# Patient Record
Sex: Male | Born: 1985 | Race: Black or African American | Hispanic: No | Marital: Single | Smoking: Never smoker
Health system: Southern US, Community
[De-identification: ages and names within clinical notes are randomized; demographics above are authoritative.]

## PROBLEM LIST (undated history)

## (undated) DIAGNOSIS — T7840XA Allergy, unspecified, initial encounter: Secondary | ICD-10-CM

## (undated) DIAGNOSIS — K219 Gastro-esophageal reflux disease without esophagitis: Secondary | ICD-10-CM

## (undated) DIAGNOSIS — F419 Anxiety disorder, unspecified: Secondary | ICD-10-CM

## (undated) HISTORY — DX: Anxiety disorder, unspecified: F41.9

## (undated) HISTORY — DX: Allergy, unspecified, initial encounter: T78.40XA

## (undated) HISTORY — DX: Gastro-esophageal reflux disease without esophagitis: K21.9

---

## 2008-05-16 ENCOUNTER — Ambulatory Visit: Payer: Self-pay | Admitting: Family Medicine

## 2008-05-16 LAB — CONVERTED CEMR LAB
ALT: 23 units/L (ref 0–53)
AST: 20 units/L (ref 0–37)
Albumin: 4.6 g/dL (ref 3.5–5.2)
Alkaline Phosphatase: 65 units/L (ref 39–117)
BUN: 8 mg/dL (ref 6–23)
Basophils Absolute: 0.1 10*3/uL (ref 0.0–0.1)
Basophils Relative: 1 % (ref 0–1)
Eosinophils Absolute: 0.4 10*3/uL (ref 0.0–0.7)
HDL: 51 mg/dL (ref >39)
LDL Cholesterol: 67 mg/dL (ref 0–99)
MCHC: 34.4 g/dL (ref 30.0–36.0)
MCV: 87.6 fL (ref 78.0–100.0)
Monocytes Relative: 8 % (ref 3–12)
Neutrophils Relative %: 49 % (ref 43–77)
Platelets: 268 10*3/uL (ref 150–400)
Potassium: 4 meq/L (ref 3.5–5.3)
RBC: 5.15 M/uL (ref 4.22–5.81)
RDW: 12.7 % (ref 11.5–15.5)
Total CHOL/HDL Ratio: 2.9

## 2012-04-17 ENCOUNTER — Ambulatory Visit (INDEPENDENT_AMBULATORY_CARE_PROVIDER_SITE_OTHER): Payer: BC Managed Care – PPO | Admitting: Family Medicine

## 2012-04-17 VITALS — BP 144/80 | HR 84 | Temp 97.4°F | Resp 16 | Ht 69.0 in | Wt 140.0 lb

## 2012-04-17 DIAGNOSIS — Z0289 Encounter for other administrative examinations: Secondary | ICD-10-CM

## 2012-04-17 DIAGNOSIS — Z0000 Encounter for general adult medical examination without abnormal findings: Secondary | ICD-10-CM | POA: Insufficient documentation

## 2012-04-17 NOTE — Patient Instructions (Signed)
Rotator Cuff Injury  The rotator cuff is the collective set of muscles and tendons that make up the stabilizing unit of your shoulder. This unit holds in the ball of the humerus (upper arm bone) in the socket of the scapula (shoulder blade). Injuries to this stabilizing unit most commonly come from sports or activities that cause the arm to be moved repeatedly over the head. Examples of this include throwing, weight lifting, swimming, racquet sports, or an injury such as falling on your arm. Chronic (longstanding) irritation of this unit can cause inflammation (soreness), bursitis, and eventual damage to the tendons to the point of rupture (tear). An acute (sudden) injury of the rotator cuff can result in a partial or complete tear. You may need surgery with complete tears. Small or partial rotator cuff tears may be treated conservatively with temporary immobilization, exercises and rest. Physical therapy may be needed.  HOME CARE INSTRUCTIONS    Apply ice to the injury for 15 to 20 minutes 3 to 4 times per day for the first 2 days. Put the ice in a plastic bag and place a towel between the bag of ice and your skin.   If you have a shoulder immobilizer (sling and straps), do not remove it for as long as directed by your caregiver or until you see a caregiver for a follow-up examination. If you need to remove it, move your arm as little as possible.   You may want to sleep on several pillows or in a recliner at night to lessen swelling and pain.   Only take over-the-counter or prescription medicines for pain, discomfort, or fever as directed by your caregiver.   Do simple hand squeezing exercises with a soft rubber ball to decrease hand swelling.  SEEK MEDICAL CARE IF:    Pain in your shoulder increases or new pain or numbness develops in your arm, hand, or fingers.   Your hand or fingers are colder than your other hand.  SEEK IMMEDIATE MEDICAL CARE IF:    Your arm, hand, or fingers are numb or  tingling.   Your arm, hand, or fingers are increasingly swollen and painful, or turn white or blue.  Document Released: 09/16/2000 Document Revised: 09/08/2011 Document Reviewed: 09/09/2008  [employer] Patient Information 2012 [employer].
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
## 2019-07-19 ENCOUNTER — Other Ambulatory Visit: Payer: Self-pay

## 2019-07-19 DIAGNOSIS — Z20822 Contact with and (suspected) exposure to covid-19: Secondary | ICD-10-CM

## 2019-07-21 LAB — NOVEL CORONAVIRUS, NAA: SARS-CoV-2, NAA: DETECTED — AB

## 2020-01-28 ENCOUNTER — Ambulatory Visit
Admission: EM | Admit: 2020-01-28 | Discharge: 2020-01-28 | Disposition: A | Discharge status: Home / Self Care | Payer: Self-pay | Attending: Physician Assistant | Admitting: Physician Assistant

## 2020-01-28 ENCOUNTER — Other Ambulatory Visit: Payer: Self-pay

## 2020-01-28 ENCOUNTER — Encounter: Payer: Self-pay | Admitting: Emergency Medicine

## 2020-01-28 ENCOUNTER — Ambulatory Visit (INDEPENDENT_AMBULATORY_CARE_PROVIDER_SITE_OTHER): Payer: Self-pay

## 2020-01-28 DIAGNOSIS — M79672 Pain in left foot: Secondary | ICD-10-CM

## 2020-01-28 MED ORDER — MELOXICAM 7.5 MG PO TABS: 7.5000 mg | ORAL_TABLET | Freq: Every day | ORAL | 0 refills | Status: DC

## 2020-01-28 NOTE — ED Triage Notes (Signed)
Pt here for left foot pain after injuring 2 days ago while skate boarding; CMS intact

## 2020-01-28 NOTE — ED Provider Notes (Signed)
[facility]    CSN: [id number] Arrival date & time: 01/28/20  6712      History   Chief Complaint Chief Complaint  Patient presents with  . Foot Pain    HPI Derek Knight is a 34 y.o. male.   34 year old male comes in for 2 day history of left foot pain after injury. Was skateboarding when he inverted left foot/ankle. Has had pain and swelling with painful weightbearing since. Had some tingling that resolved. Taking ibuprofen with mild relief. Returns to work tomorrow, and will be required to stand/walk for long hours and therefore came in for evaluation.      History reviewed. No pertinent past medical history.  Patient Active Problem List   Diagnosis Date Noted  . Annual physical exam 04/17/2012    History reviewed. No pertinent surgical history.     Home Medications    Prior to Admission medications   Medication Sig Start Date End Date Taking? Authorizing Provider  meloxicam (MOBIC) 7.5 MG tablet Take 1 tablet (7.5 mg total) by mouth daily. 01/28/20   Belinda Fisher, PA-C    Family History Family History  Problem Relation Age of Onset  . Diabetes Father     Social History Social History   Tobacco Use  . Smoking status: Never Smoker  . Smokeless tobacco: Never Used  Substance Use Topics  . Alcohol use: Yes  . Drug use: Never     Allergies   Patient has no known allergies.   Review of Systems Review of Systems  Reason unable to perform ROS: See HPI as above.     Physical Exam Triage Vital Signs ED Triage Vitals [01/28/20 0930]  Enc Vitals Group     BP (!) 143/96     Pulse Rate 76     Resp 18     Temp 98.2 F (36.8 C)     Temp Source Oral     SpO2 99 %     Weight      Height      Head Circumference      Peak Flow      Pain Score 4     Pain Loc      Pain Edu?      Excl. in GC?    No data found.  Updated Vital Signs BP (!) 143/96 (BP Location: Left Arm)   Pulse 76   Temp 98.2 F (36.8 C) (Oral)   Resp 18    SpO2 99%   Physical Exam Constitutional:      General: He is not in acute distress.    Appearance: Normal appearance. He is well-developed. He is not toxic-appearing or diaphoretic.  HENT:     Head: Normocephalic and atraumatic.  Eyes:     Conjunctiva/sclera: Conjunctivae normal.     Pupils: Pupils are equal, round, and reactive to light.  Pulmonary:     Effort: Pulmonary effort is normal. No respiratory distress.     Comments: Speaking in full sentences without difficulty Musculoskeletal:     Cervical back: Normal range of motion and neck supple.     Comments: Mild swelling to the proximal 4th and 5th MTP. No contusion, erythema, warmth. No tenderness to palpation of the ankle. Tenderness to palpation along 4th and 5th MTP. Full ROM of ankle/toes. Strength 5/5. Sensation intact, pedal pulse 2+  Skin:    General: Skin is warm and dry.  Neurological:     Mental Status: He is alert  and oriented to person, place, and time.      UC Treatments / Results  Labs (all labs ordered are listed, but only abnormal results are displayed) Labs Reviewed - No data to display  EKG   Radiology DG Foot Complete Left  Result Date: 01/28/2020 CLINICAL DATA:  Left foot pain EXAM: LEFT FOOT - COMPLETE 3+ VIEW COMPARISON:  None. FINDINGS: Early joint space narrowing in the 1st MTP joint. No acute bony abnormality. Specifically, no fracture, subluxation, or dislocation. IMPRESSION: No acute bony abnormality. Electronically Signed   By: Rolm Baptise M.D.   On: 01/28/2020 09:45    Procedures Procedures (including critical care time)  Medications Ordered in UC Medications - No data to display  Initial Impression / Assessment and Plan / UC Course  I have reviewed the triage vital signs and the nursing notes.  Pertinent labs & imaging results that were available during my care of the patient were reviewed by me and considered in my medical decision making (see chart for details).    Xray reviewed  by me negative for fracture, which was confirmed by radiology. NSAIDs, ice compress, ace wrap during activity. Expected course of healing discussed. Return precautions given. Patient expresses understanding and agrees to plan.  Final Clinical Impressions(s) / UC Diagnoses   Final diagnoses:  Left foot pain    ED Prescriptions    Medication Sig Dispense Auth. Provider   meloxicam (MOBIC) 7.5 MG tablet Take 1 tablet (7.5 mg total) by mouth daily. 15 tablet Ok Edwards, PA-C     PDMP not reviewed this encounter.   Ok Edwards, PA-C 01/28/20 [phone]

## 2020-01-28 NOTE — Discharge Instructions (Signed)
Start Mobic. Do not take ibuprofen (motrin/advil)/ naproxen (aleve) while on mobic. Ice compress, elevation, ace wrap during activity.  This may take a few weeks to completely resolve, but should be feeling better each week.  Follow-up with sports medicine for further evaluation if symptoms not improving.

## 2020-12-11 IMAGING — DX DG FOOT COMPLETE 3+V*L*
3 series · 3 of 3 positions shown · non-contrast
Comparison: None.

CLINICAL DATA: Left foot pain

EXAM:
LEFT FOOT - COMPLETE 3+ VIEW

[foot supine dp]
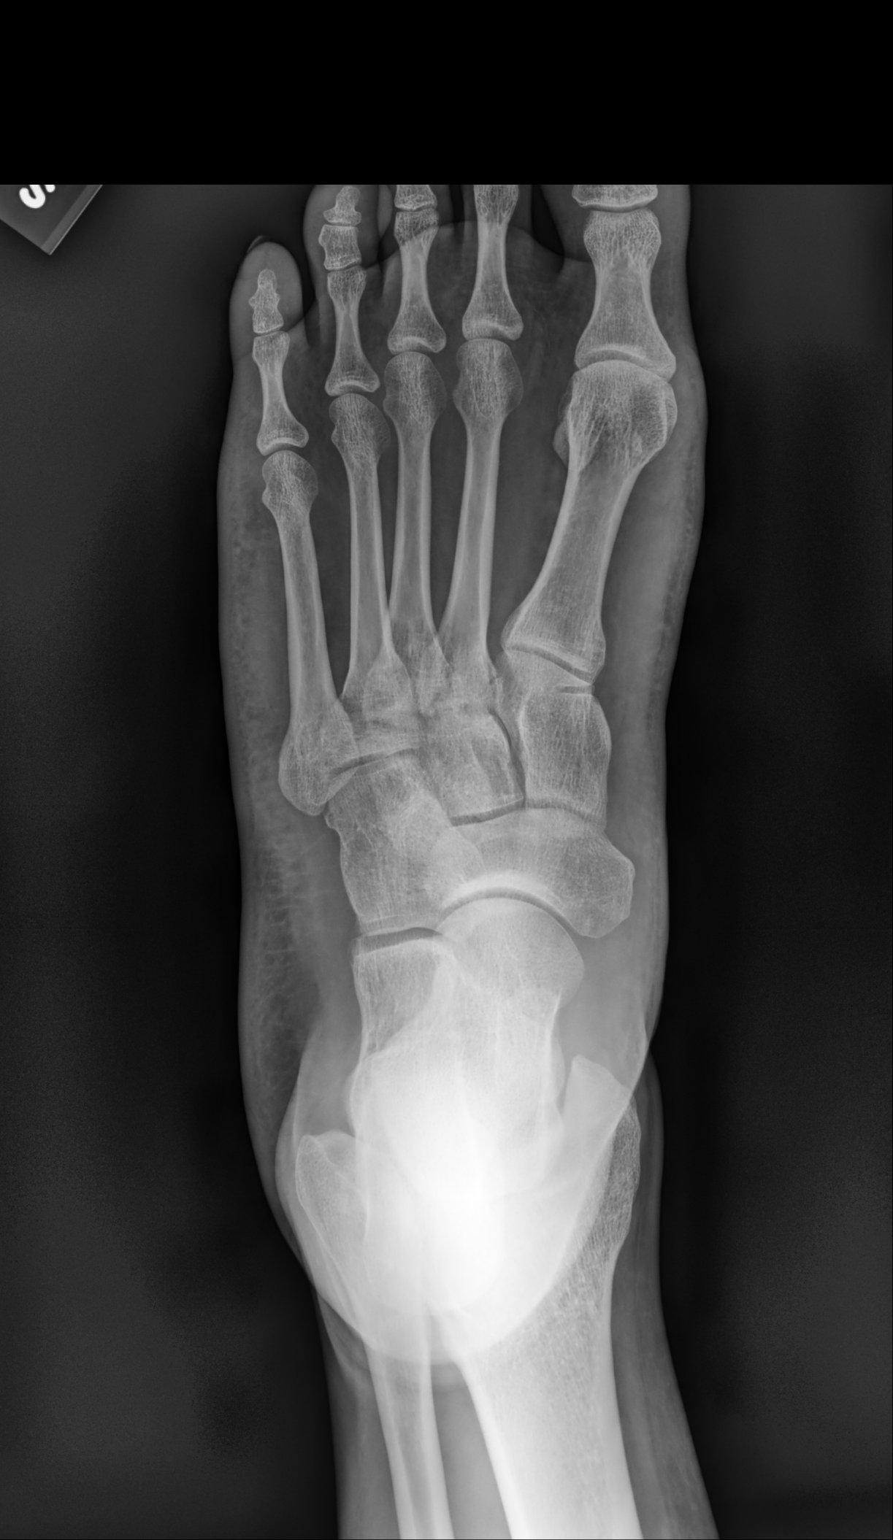

[foot medial oblique]
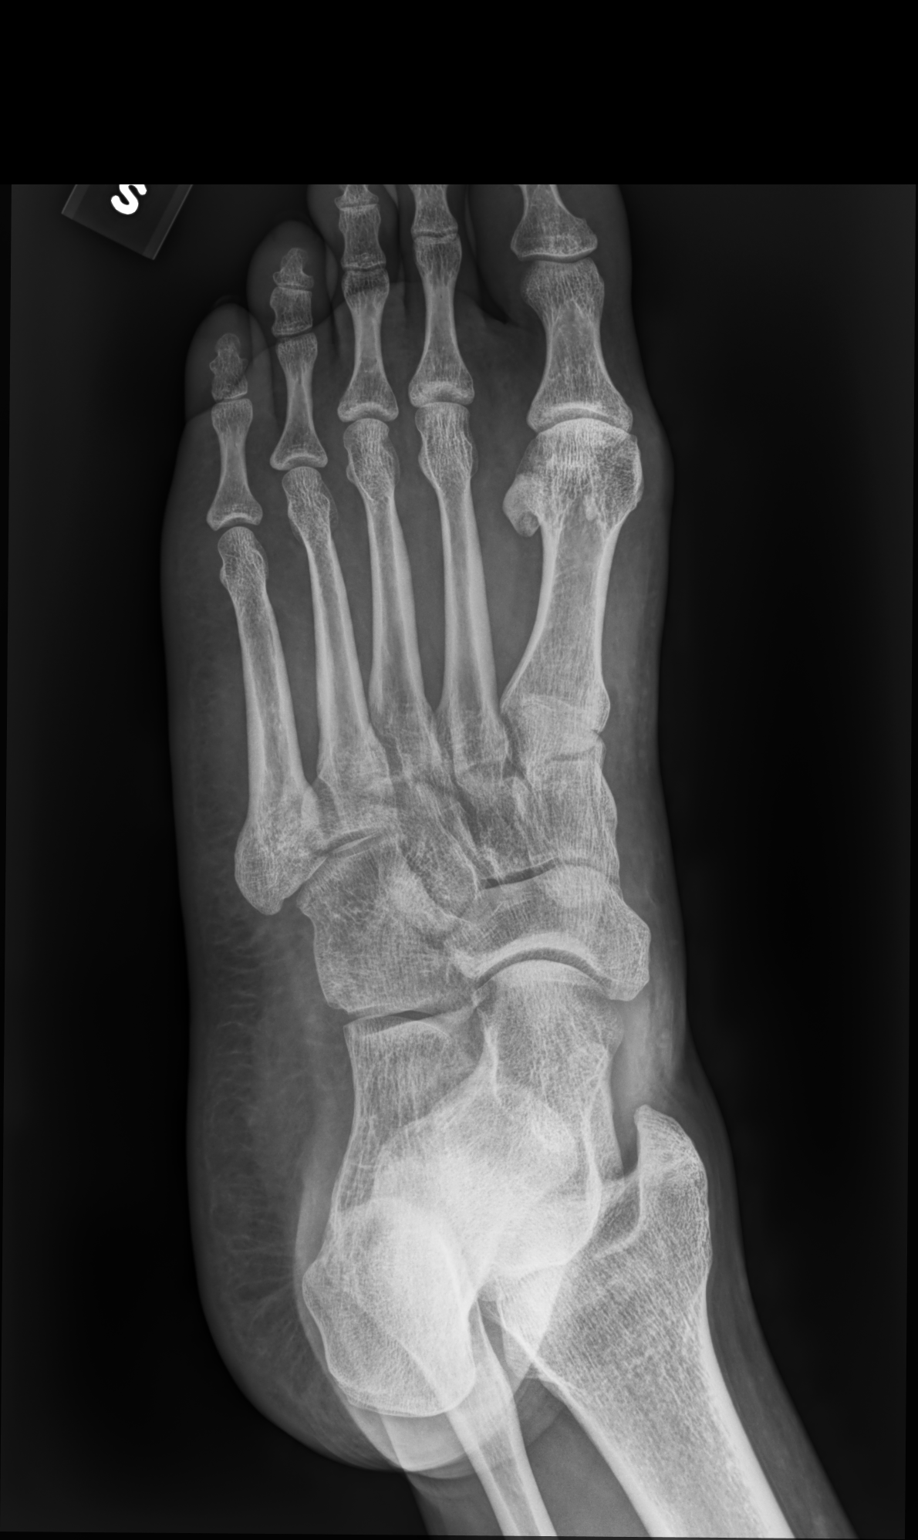

[foot supine lat]
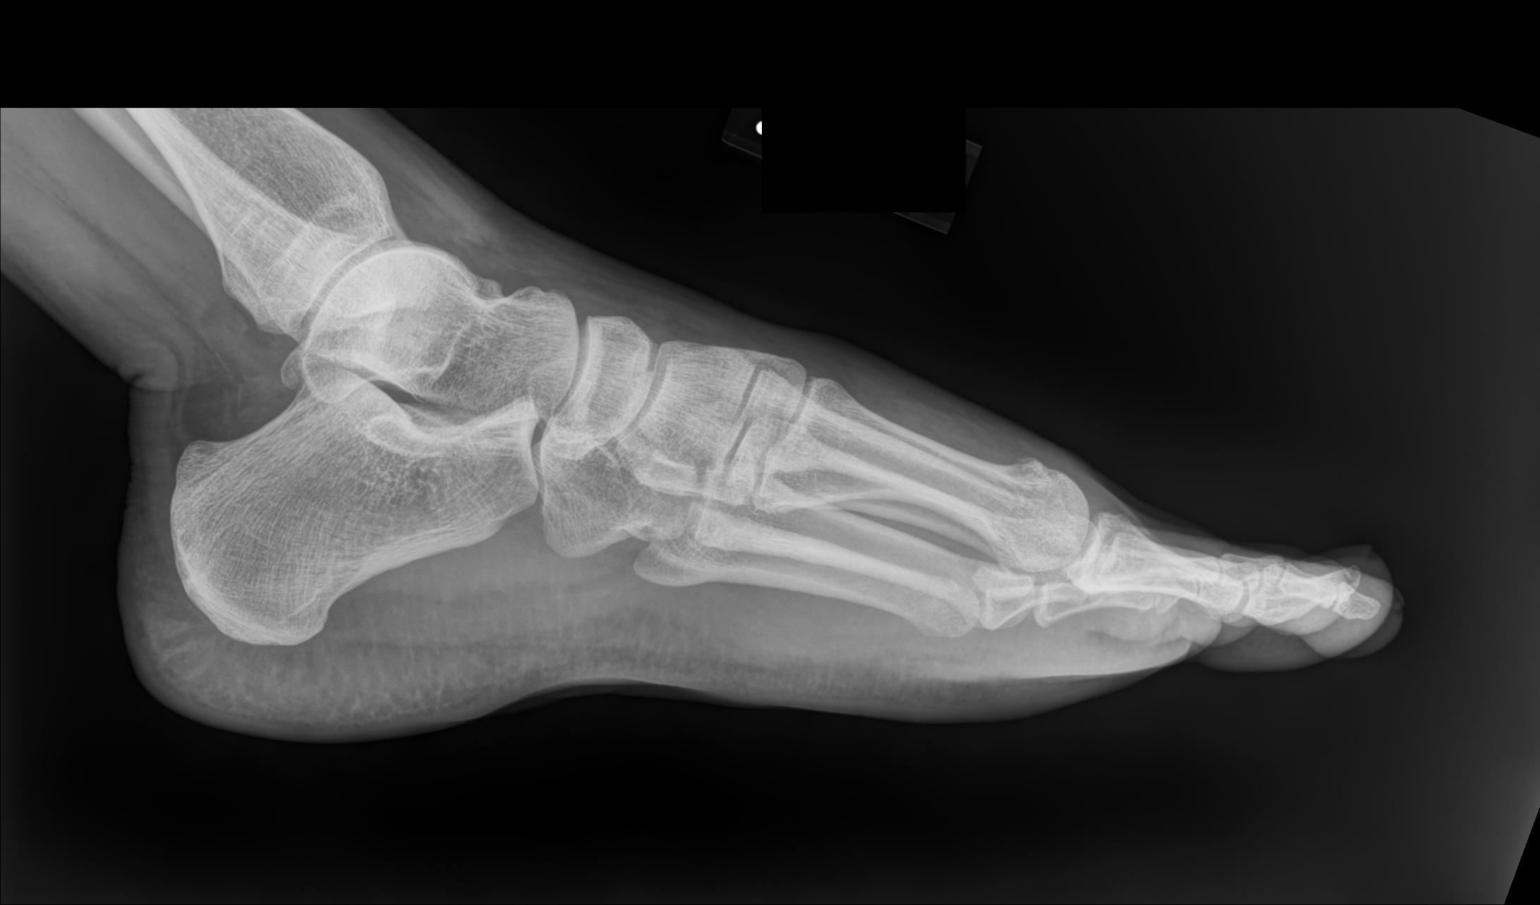

[3 of 3 positions shown; findings below may reference images not displayed]

FINDINGS: Early joint space narrowing in the 1st MTP joint. No acute bony
abnormality. Specifically, no fracture, subluxation, or dislocation.
IMPRESSION: No acute bony abnormality.

## 2021-02-12 ENCOUNTER — Ambulatory Visit
Admission: EM | Admit: 2021-02-12 | Discharge: 2021-02-12 | Disposition: A | Discharge status: Home / Self Care | Payer: BC Managed Care – PPO | Attending: Emergency Medicine | Admitting: Emergency Medicine

## 2021-02-12 ENCOUNTER — Other Ambulatory Visit: Payer: Self-pay

## 2021-02-12 DIAGNOSIS — H66011 Acute suppurative otitis media with spontaneous rupture of ear drum, right ear: Secondary | ICD-10-CM

## 2021-02-12 MED ORDER — CETIRIZINE HCL 10 MG PO CAPS: 10.0000 mg | ORAL_CAPSULE | Freq: Every day | ORAL | 0 refills | Status: DC

## 2021-02-12 MED ORDER — FLUTICASONE PROPIONATE 50 MCG/ACT NA SUSP: 1.0000 | Freq: Every day | NASAL | 0 refills | Status: DC

## 2021-02-12 MED ORDER — AMOXICILLIN-POT CLAVULANATE 875-125 MG PO TABS: 1.0000 | ORAL_TABLET | Freq: Two times a day (BID) | ORAL | 0 refills | Status: AC

## 2021-02-12 NOTE — Discharge Instructions (Signed)
Begin Augmentin twice daily x1 week Daily cetirizine and Flonase to help with sinus congestion, allergies, 30 irritation Drink plenty fluids Follow-up with ENT Follow-up if any symptoms changing worsening or not improve

## 2021-02-12 NOTE — ED Triage Notes (Signed)
Patient presents to Urgent Care with complaints of right ear fullness, throat and right arm tingling sensation that started a couple hours ago. Pt states arm tingling has resolved.  Denies fever, no changes in vision, or speech.

## 2021-02-13 NOTE — ED Provider Notes (Signed)
[facility]    CSN: [id number] Arrival date & time: 02/12/21  1613      History   Chief Complaint Chief Complaint  Patient presents with  . Otalgia  . Sore Throat    HPI NISSAN FRAZZINI is a 35 y.o. male presenting today for evaluation of sore throat and ear pain.  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  Reports tingling sensation in right arm which is since resolved  HPI  History reviewed. No pertinent past medical history.  Patient Active Problem List   Diagnosis Date Noted  . Annual physical exam 04/17/2012    History reviewed. No pertinent surgical history.     Home Medications    Prior to Admission medications   Medication Sig Start Date End Date Taking? Authorizing Provider  amoxicillin-clavulanate (AUGMENTIN) 875-125 MG tablet Take 1 tablet by mouth every 12 (twelve) hours for 7 days. 02/12/21 02/19/21 Yes Veneta Sliter C, PA-C  Cetirizine HCl 10 MG CAPS Take 1 capsule (10 mg total) by mouth daily for 10 days. 02/12/21 02/22/21 Yes Shalinda Burkholder C, PA-C  fluticasone (FLONASE) 50 MCG/ACT nasal spray Place 1-2 sprays into both nostrils daily. 02/12/21  Yes Josha Weekley C, PA-C  meloxicam (MOBIC) 7.5 MG tablet Take 1 tablet (7.5 mg total) by mouth daily. 01/28/20   Belinda Fisher, PA-C    Family History Family History  Problem Relation Age of Onset  . Diabetes Father     Social History Social History   Tobacco Use  . Smoking status: Never Smoker  . Smokeless tobacco: Never Used  Substance Use Topics  . Alcohol use: Yes  . Drug use: Never     Allergies   Patient has no known allergies.   Review of Systems Review of Systems  Constitutional: Negative for activity change, appetite change, chills, fatigue and fever.  HENT: Positive for congestion, ear discharge and rhinorrhea. Negative for ear pain, sinus pressure, sore throat and trouble swallowing.   Eyes:  Negative for discharge and redness.  Respiratory: Negative for cough, chest tightness and shortness of breath.   Cardiovascular: Negative for chest pain.  Gastrointestinal: Negative for abdominal pain, diarrhea, nausea and vomiting.  Musculoskeletal: Negative for myalgias.  Skin: Negative for rash.  Neurological: Negative for dizziness, light-headedness and headaches.     Physical Exam Triage Vital Signs ED Triage Vitals  Enc Vitals Group     BP 02/12/21 1831 126/78     Pulse Rate 02/12/21 1831 66     Resp 02/12/21 1831 20     Temp 02/12/21 1831 97.7 F (36.5 C)     Temp Source 02/12/21 1831 Oral     SpO2 02/12/21 1831 98 %     Weight --      Height --      Head Circumference --      Peak Flow --      Pain Score 02/12/21 1937 0     Pain Loc --      Pain Edu? --      Excl. in GC? --    No data found.  Updated Vital Signs BP 126/78 (BP Location: Left Arm)   Pulse 66   Temp 97.7 F (36.5 C) (Oral)   Resp 20   SpO2 98%   Visual Acuity Right Eye Distance:   Left Eye Distance:   Bilateral Distance:    Right Eye Near:   Left Eye Near:  Bilateral Near:     Physical Exam Vitals and nursing note reviewed.  Constitutional:      Appearance: He is well-developed.     Comments: No acute distress  HENT:     Head: Normocephalic and atraumatic.     Ears:     Comments: Right TM with rupture, appears opaque and dull, pustular drainage noted    Nose: Nose normal.  Eyes:     Conjunctiva/sclera: Conjunctivae normal.  Cardiovascular:     Rate and Rhythm: Normal rate.  Pulmonary:     Effort: Pulmonary effort is normal. No respiratory distress.     Comments: Breathing comfortably at rest, CTABL, no wheezing, rales or other adventitious sounds auscultated Abdominal:     General: There is no distension.  Musculoskeletal:        General: Normal range of motion.     Cervical back: Neck supple.  Skin:    General: Skin is warm and dry.  Neurological:     Mental Status: He  is alert and oriented to person, place, and time.      UC Treatments / Results  Labs (all labs ordered are listed, but only abnormal results are displayed) Labs Reviewed - No data to display  EKG   Radiology No results found.  Procedures Procedures (including critical care time)  Medications Ordered in UC Medications - No data to display  Initial Impression / Assessment and Plan / UC Course  I have reviewed the triage vital signs and the nursing notes.  Pertinent labs & imaging results that were available during my care of the patient were reviewed by me and considered in my medical decision making (see chart for details).     Treating for otitis media with rupture with Augmentin, recommended Zyrtec and Flonase to further help with any underlying congestion/eustachian tube dysfunction.  Discussed strict return precautions. Patient verbalized understanding and is agreeable with plan.  Final Clinical Impressions(s) / UC Diagnoses   Final diagnoses:  Non-recurrent acute suppurative otitis media of right ear with spontaneous rupture of tympanic membrane     Discharge Instructions     Begin Augmentin twice daily x1 week Daily cetirizine and Flonase to help with sinus congestion, allergies, 30 irritation Drink plenty fluids Follow-up with ENT Follow-up if any symptoms changing worsening or not improve    ED Prescriptions    Medication Sig Dispense Auth. Provider   amoxicillin-clavulanate (AUGMENTIN) 875-125 MG tablet Take 1 tablet by mouth every 12 (twelve) hours for 7 days. 14 tablet Theadora Noyes C, PA-C   fluticasone (FLONASE) 50 MCG/ACT nasal spray Place 1-2 sprays into both nostrils daily. 16 g Leviathan Macera C, PA-C   Cetirizine HCl 10 MG CAPS Take 1 capsule (10 mg total) by mouth daily for 10 days. 10 capsule Carmelo Reidel, [place] C, PA-C     PDMP not reviewed this encounter.   Lew Dawes, [place] 02/13/21 [phone]

## 2021-03-03 DIAGNOSIS — R0981 Nasal congestion: Secondary | ICD-10-CM | POA: Insufficient documentation

## 2021-10-15 ENCOUNTER — Ambulatory Visit: Payer: BC Managed Care – PPO

## 2021-10-15 ENCOUNTER — Emergency Department (HOSPITAL_BASED_OUTPATIENT_CLINIC_OR_DEPARTMENT_OTHER): Payer: BC Managed Care – PPO | Admitting: Radiology

## 2021-10-15 ENCOUNTER — Ambulatory Visit
Admission: EM | Admit: 2021-10-15 | Discharge: 2021-10-15 | Disposition: A | Discharge status: Home / Self Care | Payer: BC Managed Care – PPO | Attending: Internal Medicine | Admitting: Internal Medicine

## 2021-10-15 ENCOUNTER — Other Ambulatory Visit: Payer: Self-pay

## 2021-10-15 ENCOUNTER — Encounter (HOSPITAL_BASED_OUTPATIENT_CLINIC_OR_DEPARTMENT_OTHER): Payer: Self-pay

## 2021-10-15 ENCOUNTER — Emergency Department (HOSPITAL_BASED_OUTPATIENT_CLINIC_OR_DEPARTMENT_OTHER)
Admission: EM | Admit: 2021-10-15 | Discharge: 2021-10-15 | Disposition: A | Discharge status: Home / Self Care | Payer: BC Managed Care – PPO | Attending: Emergency Medicine | Admitting: Emergency Medicine

## 2021-10-15 DIAGNOSIS — J069 Acute upper respiratory infection, unspecified: Secondary | ICD-10-CM

## 2021-10-15 DIAGNOSIS — R079 Chest pain, unspecified: Secondary | ICD-10-CM | POA: Insufficient documentation

## 2021-10-15 DIAGNOSIS — R9431 Abnormal electrocardiogram [ECG] [EKG]: Secondary | ICD-10-CM | POA: Diagnosis not present

## 2021-10-15 DIAGNOSIS — Z20822 Contact with and (suspected) exposure to covid-19: Secondary | ICD-10-CM | POA: Diagnosis not present

## 2021-10-15 DIAGNOSIS — R0789 Other chest pain: Secondary | ICD-10-CM | POA: Diagnosis not present

## 2021-10-15 LAB — CBC
HCT: 45.7 % (ref 39.0–52.0)
Hemoglobin: 15.5 g/dL (ref 13.0–17.0)
MCH: 29.8 pg (ref 26.0–34.0)
MCHC: 33.9 g/dL (ref 30.0–36.0)
MCV: 87.7 fL (ref 80.0–100.0)
Platelets: 270 10*3/uL (ref 150–400)
RBC: 5.21 MIL/uL (ref 4.22–5.81)
RDW: 12 % (ref 11.5–15.5)
WBC: 6.2 10*3/uL (ref 4.0–10.5)
nRBC: 0 % (ref 0.0–0.2)

## 2021-10-15 LAB — BASIC METABOLIC PANEL
Anion gap: 12 (ref 5–15)
BUN: 11 mg/dL (ref 6–20)
CO2: 22 mmol/L (ref 22–32)
Calcium: 9.6 mg/dL (ref 8.9–10.3)
Chloride: 101 mmol/L (ref 98–111)
Creatinine, Ser: 0.9 mg/dL (ref 0.61–1.24)
GFR, Estimated: >60 mL/min (ref >60)
Glucose, Bld: 97 mg/dL (ref 70–99)
Potassium: 3.6 mmol/L (ref 3.5–5.1)
Sodium: 135 mmol/L (ref 135–145)

## 2021-10-15 LAB — RESP PANEL BY RT-PCR (FLU A&B, COVID) ARPGX2
Influenza A by PCR: NEGATIVE
Influenza B by PCR: NEGATIVE
SARS Coronavirus 2 by RT PCR: NEGATIVE

## 2021-10-15 LAB — TROPONIN I (HIGH SENSITIVITY): Troponin I (High Sensitivity): <2 ng/L (ref <18)

## 2021-10-15 MED ORDER — ALUM & MAG HYDROXIDE-SIMETH 200-200-20 MG/5ML PO SUSP
30.0000 mL | Freq: Once | ORAL | Status: AC
  Administered 2021-10-15: 30 mL via ORAL
  Filled 2021-10-15: qty 30

## 2021-10-15 MED ORDER — LIDOCAINE VISCOUS HCL 2 % MT SOLN
15.0000 mL | Freq: Once | OROMUCOSAL | Status: AC
  Administered 2021-10-15: 15 mL via ORAL
  Filled 2021-10-15: qty 15

## 2021-10-15 MED ORDER — FAMOTIDINE 20 MG PO TABS: 20.0000 mg | ORAL_TABLET | Freq: Two times a day (BID) | ORAL | 0 refills | Status: DC

## 2021-10-15 NOTE — ED Provider Notes (Signed)
[facility] Provider Note   CSN: [id number] Arrival date & time: 10/15/21  1140     History  Chief Complaint  Patient presents with   Abnormal ECG    Derek Knight is a 36 y.o. male.  HPI  Patient with no contributable medical history presents due to chest pain.  Its been intermittent for the last 5 days, last for about 5 seconds when it occurs.  He feels it to the right side of his chest, does not radiate.  Feels like a burning sensation.  He notices it more when he lays flat, no relationship to food or exertion.  There is no nausea or vomiting.  Patient reports she was seen earlier today at urgent care, there were PACs and PVCs on his EKG today advised him to go to the ED for additional cardiac work-up.  Patient does not have a cardiac family history, does not take medicines hypertension, hyperlipidemia, diabetes.  No previous MI or PE.  Patient does drink up to 8 cups of caffeine daily.  Home Medications Prior to Admission medications   Medication Sig Start Date End Date Taking? Authorizing Provider  famotidine (PEPCID) 20 MG tablet Take 1 tablet (20 mg total) by mouth 2 (two) times daily. 10/15/21  Yes Sherrill Raring, PA-C  Cetirizine HCl 10 MG CAPS Take 1 capsule (10 mg total) by mouth daily for 10 days. 02/12/21 02/22/21  Wieters, Hallie C, PA-C  fluticasone (FLONASE) 50 MCG/ACT nasal spray Place 1-2 sprays into both nostrils daily. 02/12/21   Wieters, Hallie C, PA-C  meloxicam (MOBIC) 7.5 MG tablet Take 1 tablet (7.5 mg total) by mouth daily. 01/28/20   Ok Edwards, PA-C      Allergies    Patient has no known allergies.    Review of Systems   Review of Systems Per HPI  Physical Exam Updated Vital Signs BP 127/84    Pulse 76    Temp 98.3 F (36.8 C) (Oral)    Resp 17    Ht 5\' 6"  (1.676 m)    Wt 71.2 kg    SpO2 98%    BMI 25.34 kg/m  Physical Exam Vitals and nursing note reviewed. Exam conducted with a chaperone present.  Constitutional:       Appearance: Normal appearance.  HENT:     Head: Normocephalic and atraumatic.     Ears:     Comments: Left TM perforation, right TM intact.  No erythema or bulging TM.    Nose: Congestion present.  Eyes:     General: No scleral icterus.       Right eye: No discharge.        Left eye: No discharge.     Extraocular Movements: Extraocular movements intact.     Pupils: Pupils are equal, round, and reactive to light.  Cardiovascular:     Rate and Rhythm: Normal rate and regular rhythm.     Pulses: Normal pulses.     Heart sounds: Normal heart sounds. No murmur heard.   No friction rub. No gallop.  Pulmonary:     Effort: Pulmonary effort is normal. No respiratory distress.     Breath sounds: Normal breath sounds.  Abdominal:     General: Abdomen is flat. Bowel sounds are normal. There is no distension.     Palpations: Abdomen is soft.     Tenderness: There is no abdominal tenderness.  Skin:    General: Skin is warm and dry.  Coloration: Skin is not jaundiced.  Neurological:     Mental Status: He is alert. Mental status is at baseline.     Coordination: Coordination normal.    ED Results / Procedures / Treatments   Labs (all labs ordered are listed, but only abnormal results are displayed) Labs Reviewed  RESP PANEL BY RT-PCR (FLU A&B, COVID) ARPGX2  BASIC METABOLIC PANEL  CBC  TROPONIN I (HIGH SENSITIVITY)    EKG None  Radiology DG Chest 2 View  Result Date: 10/15/2021 CLINICAL DATA:  Abnormal EKG. EXAM: CHEST - 2 VIEW COMPARISON:  None. FINDINGS: The heart size and mediastinal contours are within normal limits. Both lungs are clear. The visualized skeletal structures are unremarkable. IMPRESSION: No active cardiopulmonary disease. Electronically Signed   By: Davina Poke D.O.   On: 10/15/2021 12:18    Procedures Procedures    Medications Ordered in ED Medications  alum & mag hydroxide-simeth (MAALOX/MYLANTA) 200-200-20 MG/5ML suspension 30 mL (30 mLs Oral  Given 10/15/21 1250)    And  lidocaine (XYLOCAINE) 2 % viscous mouth solution 15 mL (15 mLs Oral Given 10/15/21 1250)    ED Course/ Medical Decision Making/ A&P                           Medical Decision Making  This is a 36 year old male presenting due to chest pain.  I personally reviewed the labs and imaging ordered today in the ED.  I agree with the radiologist interpretation of the chest x-ray.  I also independently reviewed the provider note from the urgent care visit earlier today.  Patient vital signs are stable, he is not hypoxic or tachycardic.  Physical exam is unremarkable.  He is also PERC negative, I do not suspect this is related to a PE.  Could be related to GERD given the positional factor.  Does not sound like a pericarditis given that it is not worsened by leaving forward and its intermittent nature.  Additionally there was no global ST elevation or PR depression noted on EKG. EKG does not show any ischemic findings.  He has a normal sinus rhythm with occasional premature ventricular contractions.  He was hooked up with cardiac monitoring which I dependently reviewed showing a predominant rhythm of normal sinus rhythm with occasional premature ventricle contractions.  CBC does not show any leukocytosis, additionally no anemia.  BMP is without any electrolyte derangement, no AKI.  Chest x-ray shows normal cardiac silhouette without any evidence of pneumonia, pneumothorax, widening mediastinum concerning for dissection.  Additionally, respiratory panel is negative for any COVID or flu.  Patient was given GI cocktail, this alleviated his symptoms.  On reevaluation, patient presents improved.  His lungs remain clear to auscultation, his vital signs remained stable.  There is no signs of continued tachypnea or hypoxia concerning for acute respiratory distress.  I suspect his symptoms are more likely related to reflux disease.  I did advise him to cut back on the amount of caffeine he drinks  and prescribed Pepcid.  Patient discharged in stable condition with return precautions.  Do not feel this time he needs any additional work-up in the emergency setting.        Final Clinical Impression(s) / ED Diagnoses Final diagnoses:  Chest pain, unspecified type    Rx / DC Orders ED Discharge Orders          Ordered    famotidine (PEPCID) 20 MG tablet  2 times daily  10/15/21 1308              Sherrill Raring, PA-C 10/15/21 1316    Charlesetta Shanks, MD 10/15/21 1402

## 2021-10-15 NOTE — ED Triage Notes (Signed)
Pt c/o left ear pain, muscle soreness to left chest, dizziness, nasal congestion without drainage  Denies cough, sore throat, headache, nausea, vomiting.   Onset ~ >1 week

## 2021-10-15 NOTE — ED Triage Notes (Signed)
Pt present to UC for URI symptoms. Per pt he was sent to ED due to an abnormal EKG. Pt reports mild burning in his R chest. Pr reports increased burping. Denies ShOB, nausea, or diaphoresis.

## 2021-10-15 NOTE — ED Notes (Signed)
Patient reports he drinks 1-2 cups of coffee daily and has an energy drink during the day sometimes. Patient reports approximately 8-10 cups of coffee per week and about 4 energy drinks a week. States he also utilized vape pens.

## 2021-10-15 NOTE — Discharge Instructions (Addendum)
Your work-up today was reassuring.  There were no signs of heart attack, pneumonia or infection.  You were negative for COVID and flu.  I would follow-up with your primary care doctor if this continues, you can also try cutting back on the amount of caffeine you consume.  Try taking 20 mg of Pepcid twice daily for the next 4 weeks to see if that helps the pain.  I suspect it could also be related to reflux disease and Pepcid as an antacid.

## 2021-10-15 NOTE — Discharge Instructions (Signed)
Please go to the hospital as soon as you leave urgent care for further evaluation and management due to your chest pain.

## 2021-10-15 NOTE — ED Provider Notes (Signed)
[facility]    CSN: [id number] Arrival date & time: 10/15/21  1018      History   Chief Complaint Chief Complaint  Patient presents with   Otalgia    HPI ELCHANAN DALESANDRO is a 36 y.o. male.   Patient presents with 1 week history of nasal congestion, left ear pain, dizziness, chest pain.  Denies any known sick contacts or fever.  Patient reports that the chest pain is present throughout entire chest and is intermittent.  Chest pain is mild.  Denies any relieving or aggravating factors to chest pain.  Patient denies cough, shortness of breath, headache, sore throat, nausea, vomiting, diarrhea, abdominal pain.  Patient has taken Advil for symptoms with minimal improvement.  Patient is attributing his chest pain to heartburn but denies history of heartburn or that food makes chest pain worse.  He reports that chest pain is worsened when lying flat and upon awakening in the morning.   Otalgia  History reviewed. No pertinent past medical history.  Patient Active Problem List   Diagnosis Date Noted   Annual physical exam 04/17/2012    History reviewed. No pertinent surgical history.     Home Medications    Prior to Admission medications   Medication Sig Start Date End Date Taking? Authorizing Provider  Cetirizine HCl 10 MG CAPS Take 1 capsule (10 mg total) by mouth daily for 10 days. 02/12/21 02/22/21  Wieters, Hallie C, PA-C  fluticasone (FLONASE) 50 MCG/ACT nasal spray Place 1-2 sprays into both nostrils daily. 02/12/21   Wieters, Hallie C, PA-C  meloxicam (MOBIC) 7.5 MG tablet Take 1 tablet (7.5 mg total) by mouth daily. 01/28/20   Ok Edwards, PA-C    Family History Family History  Problem Relation Age of Onset   Diabetes Father     Social History Social History   Tobacco Use   Smoking status: Never   Smokeless tobacco: Never  Substance Use Topics   Alcohol use: Yes   Drug use: Never     Allergies   Patient has no known allergies.   Review of  Systems Review of Systems Per HPI  Physical Exam Triage Vital Signs ED Triage Vitals  Enc Vitals Group     BP 10/15/21 1035 132/86     Pulse Rate 10/15/21 1035 62     Resp 10/15/21 1035 18     Temp 10/15/21 1035 98 F (36.7 C)     Temp Source 10/15/21 1035 Oral     SpO2 10/15/21 1035 99 %     Weight --      Height --      Head Circumference --      Peak Flow --      Pain Score 10/15/21 1036 0     Pain Loc --      Pain Edu? --      Excl. in [place]? --    No data found.  Updated Vital Signs BP 132/86 (BP Location: Right Arm)    Pulse 62    Temp 98 F (36.7 C) (Oral)    Resp 18    SpO2 99%   Visual Acuity Right Eye Distance:   Left Eye Distance:   Bilateral Distance:    Right Eye Near:   Left Eye Near:    Bilateral Near:     Physical Exam Constitutional:      General: He is not in acute distress.    Appearance: Normal appearance. He is not toxic-appearing or  diaphoretic.  HENT:     Head: Normocephalic and atraumatic.     Right Ear: Tympanic membrane and ear canal normal.     Left Ear: Tympanic membrane and ear canal normal.     Nose: Congestion present.     Mouth/Throat:     Mouth: Mucous membranes are moist.     Pharynx: No posterior oropharyngeal erythema.  Eyes:     Extraocular Movements: Extraocular movements intact.     Conjunctiva/sclera: Conjunctivae normal.     Pupils: Pupils are equal, round, and reactive to light.  Cardiovascular:     Rate and Rhythm: Normal rate and regular rhythm.     Pulses: Normal pulses.     Heart sounds: Normal heart sounds.  Pulmonary:     Effort: Pulmonary effort is normal. No respiratory distress.     Breath sounds: Normal breath sounds. No stridor. No wheezing, rhonchi or rales.  Abdominal:     General: Abdomen is flat. Bowel sounds are normal.     Palpations: Abdomen is soft.  Musculoskeletal:        General: Normal range of motion.     Cervical back: Normal range of motion.  Skin:    General: Skin is warm and dry.   Neurological:     General: No focal deficit present.     Mental Status: He is alert and oriented to person, place, and time. Mental status is at baseline.  Psychiatric:        Mood and Affect: Mood normal.        Behavior: Behavior normal.     UC Treatments / Results  Labs (all labs ordered are listed, but only abnormal results are displayed) Labs Reviewed  COVID-19, FLU A+B NAA    EKG   Radiology No results found.  Procedures Procedures (including critical care time)  Medications Ordered in UC Medications - No data to display  Initial Impression / Assessment and Plan / UC Course  I have reviewed the triage vital signs and the nursing notes.  Pertinent labs & imaging results that were available during my care of the patient were reviewed by me and considered in my medical decision making (see chart for details).     Patient's symptoms appear viral in etiology.  Although, EKG was completed due to patient's inconsistent and persistent chest pain.  EKG showing sinus arrhythmia with premature ventricular complexes.  Patient denies any cardiac history.  Unable to rule out normalities on EKG as cause of patient's chest pain.  Is most likely related to respiratory illness or heartburn but patient needs more extensive evaluation given EKG abnormalities.  This extensive evaluation cannot be provided at the urgent care.  Patient was advised to go to the hospital for further evaluation and management.  Patient was agreeable with plan.  Vital signs stable at discharge.  Agree with patient self transport to the hospital. Final Clinical Impressions(s) / UC Diagnoses   Final diagnoses:  Viral upper respiratory infection  Other chest pain     Discharge Instructions      Please go to the hospital as soon as you leave urgent care for further evaluation and management due to your chest pain.    ED Prescriptions   None    PDMP not reviewed this encounter.   Teodora Medici,  [place] 10/15/21 1139

## 2021-10-16 LAB — COVID-19, FLU A+B NAA
Influenza A, NAA: NOT DETECTED
Influenza B, NAA: NOT DETECTED
SARS-CoV-2, NAA: NOT DETECTED

## 2021-10-22 DIAGNOSIS — K2970 Gastritis, unspecified, without bleeding: Secondary | ICD-10-CM | POA: Diagnosis not present

## 2021-11-03 DIAGNOSIS — R109 Unspecified abdominal pain: Secondary | ICD-10-CM | POA: Insufficient documentation

## 2021-12-01 ENCOUNTER — Encounter: Payer: Self-pay | Admitting: Family Medicine

## 2021-12-01 ENCOUNTER — Ambulatory Visit: Payer: BC Managed Care – PPO | Attending: Family Medicine | Admitting: Family Medicine

## 2021-12-01 VITALS — BP 125/72 | HR 65 | Ht 66.0 in | Wt 158.6 lb

## 2021-12-01 DIAGNOSIS — K219 Gastro-esophageal reflux disease without esophagitis: Secondary | ICD-10-CM | POA: Diagnosis not present

## 2021-12-01 DIAGNOSIS — R0789 Other chest pain: Secondary | ICD-10-CM | POA: Diagnosis not present

## 2021-12-01 DIAGNOSIS — G4709 Other insomnia: Secondary | ICD-10-CM

## 2021-12-01 MED ORDER — TRAZODONE HCL 50 MG PO TABS: 50.0000 mg | ORAL_TABLET | Freq: Every evening | ORAL | 3 refills | Status: DC | PRN

## 2021-12-01 NOTE — Progress Notes (Signed)
Still having minor chest pains. ?Nasal congestion. ?Not sleeping. ?

## 2021-12-01 NOTE — Progress Notes (Signed)
? ?Subjective:  ?Patient ID: Derek Knight, male    DOB: 1986-01-19  Age: 36 y.o. MRN: [id number] ? ?CC: Hospitalization Follow-up ? ? ?HPI ?Derek Knight is a 36 y.o. year old male here to establish care. ?He was seen 1 month ago at the ED for chest pain described as a burning sensation.  EKG was negative for ischemic findings but revealed PVCs, chest x-ray unremarkable, symptoms resolved with GI cocktail.  Per notes symptoms are suspicious for GERD. ? ?Interval History: ?He has intermittent L sided chest pain which sometimes is in his upper chest and moves around.  But he states feels like a soreness but not sharp pain. ?He currently does not take any pain medications.  He endorses lifting a lot at work. ? ?He had a visit to [facility] for a follow up. He no longer has epigastric pain and does not take any PPI. ? ?He complains of insomnia and only sleeps 4-5 hrs. When he wakes up he is unable to go back to pain. Bedtime is at 9pm ?He drinks coffee just in the morning. He saw a Doctor on demand through his job who prescribed Trazodone which has helped somewhat with his sleep now 4-5 hrs compared to complete insomnia which has been occurring over the last 3 months. ? ?No past medical history on file. ? ?No past surgical history on file. ? ?Family History  ?Problem Relation Age of Onset  ? Diabetes Father   ? ? ?No Known Allergies ? ?Outpatient Medications Prior to Visit  ?Medication Sig Dispense Refill  ? famotidine (PEPCID) 20 MG tablet Take 1 tablet (20 mg total) by mouth 2 (two) times daily. 30 tablet 0  ? fluticasone (FLONASE) 50 MCG/ACT nasal spray Place 1-2 sprays into both nostrils daily. 16 g 0  ? meloxicam (MOBIC) 7.5 MG tablet Take 1 tablet (7.5 mg total) by mouth daily. 15 tablet 0  ? Cetirizine HCl 10 MG CAPS Take 1 capsule (10 mg total) by mouth daily for 10 days. 10 capsule 0  ? ?No facility-administered medications prior to visit.  ? ? ? ?ROS ?Review of Systems  ?Constitutional:  Negative  for activity change and appetite change.  ?HENT:  Positive for congestion. Negative for sinus pressure and sore throat.   ?Eyes:  Negative for visual disturbance.  ?Respiratory:  Negative for cough, chest tightness and shortness of breath.   ?Cardiovascular:  Negative for chest pain and leg swelling.  ?Gastrointestinal:  Negative for abdominal distention, abdominal pain, constipation and diarrhea.  ?Endocrine: Negative.   ?Genitourinary:  Negative for dysuria.  ?Musculoskeletal:  Negative for joint swelling and myalgias.  ?Skin:  Negative for rash.  ?Allergic/Immunologic: Negative.   ?Neurological:  Negative for weakness, light-headedness and numbness.  ?Psychiatric/Behavioral:  Positive for sleep disturbance. Negative for dysphoric mood and suicidal ideas.   ? ?Objective:  ?BP 125/72   Pulse 65   Ht 5\' 6"  (1.676 m)   Wt 158 lb 9.6 oz (71.9 kg)   SpO2 100%   BMI 25.60 kg/m?  ? ?BP/Weight 12/01/2021 10/15/2021 10/15/2021  ?Systolic BP 125 127 132  ?Diastolic BP 72 84 86  ?Wt. (Lbs) 158.6 157 -  ?BMI 25.6 25.34 -  ? ? ? ? ?Physical Exam ?Constitutional:   ?   Appearance: He is well-developed.  ?Cardiovascular:  ?   Rate and Rhythm: Normal rate.  ?   Heart sounds: Normal heart sounds. No murmur heard. ?Pulmonary:  ?   Effort: Pulmonary effort is normal.  ?  Breath sounds: Normal breath sounds. No wheezing or rales.  ?Chest:  ?   Chest wall: No tenderness.  ?Abdominal:  ?   General: Bowel sounds are normal. There is no distension.  ?   Palpations: Abdomen is soft. There is no mass.  ?   Tenderness: There is no abdominal tenderness.  ?Musculoskeletal:     ?   General: Normal range of motion.  ?   Right lower leg: No edema.  ?   Left lower leg: No edema.  ?Neurological:  ?   Mental Status: He is alert and oriented to person, place, and time.  ?Psychiatric:     ?   Mood and Affect: Mood normal.  ? ? ?CMP Latest Ref Rng & Units 10/15/2021 05/16/2008  ?Glucose 70 - 99 mg/dL 97 50(K)  ?BUN 6 - 20 mg/dL 11 8  ?Creatinine 0.61  - 1.24 mg/dL 9.38 1.82  ?Sodium 135 - 145 mmol/L 135 141  ?Potassium 3.5 - 5.1 mmol/L 3.6 4.0  ?Chloride 98 - 111 mmol/L 101 105  ?CO2 22 - 32 mmol/L 22 24  ?Calcium 8.9 - 10.3 mg/dL 9.6 8.8  ?Total Protein 6.0 - 8.3 g/dL - 7.4  ?Total Bilirubin 0.3 - 1.2 mg/dL - 0.8  ?Alkaline Phos 39 - 117 units/L - 65  ?AST 0 - 37 units/L - 20  ?ALT 0 - 53 units/L - 23  ? ? ?Lipid Panel  ?   ?Component Value Date/Time  ? CHOL 150 05/16/2008 2002  ? TRIG 162 (H) 05/16/2008 2002  ? HDL 51 05/16/2008 2002  ? CHOLHDL 2.9 Ratio 05/16/2008 2002  ? VLDL 32 05/16/2008 2002  ? LDLCALC 67 05/16/2008 2002  ? ? ?CBC ?   ?Component Value Date/Time  ? WBC 6.2 10/15/2021 1200  ? RBC 5.21 10/15/2021 1200  ? HGB 15.5 10/15/2021 1200  ? HCT 45.7 10/15/2021 1200  ? PLT 270 10/15/2021 1200  ? MCV 87.7 10/15/2021 1200  ? MCH 29.8 10/15/2021 1200  ? MCHC 33.9 10/15/2021 1200  ? RDW 12.0 10/15/2021 1200  ? LYMPHSABS 2.4 05/16/2008 2002  ? MONOABS 0.5 05/16/2008 2002  ? EOSABS 0.4 05/16/2008 2002  ? BASOSABS 0.1 05/16/2008 2002  ? ? ?No results found for: HGBA1C ? ?Assessment & Plan:  ?1. Other insomnia ?Uncontrolled ?He has had some improvement on trazodone ?Discussed with him that given his young age I am skeptical on increasing his current dose of trazodone ?He does get about 4 to 5 hours of sleep and we have discussed sleep hygiene and I will follow-up at his next visit with ?- traZODone (DESYREL) 50 MG tablet; Take 1 tablet (50 mg total) by mouth at bedtime as needed for sleep.  Dispense: 30 tablet; Refill: 3 ? ?2. Gastroesophageal reflux disease without esophagitis ?Symptoms have improved ?Use PPI as needed ? ?3. Musculoskeletal chest pain ?Given heavy lifting at work this could explain his symptoms ?GI etiology is also possible ?Advised to use analgesic if this occurs ? ? ?Meds ordered this encounter  ?Medications  ? traZODone (DESYREL) 50 MG tablet  ?  Sig: Take 1 tablet (50 mg total) by mouth at bedtime as needed for sleep.  ?  Dispense:  30  tablet  ?  Refill:  3  ? ? ?Follow-up: Return in about 3 months (around 03/03/2022).  ? ? ? ? ? ?Hoy Register, MD, FAAFP. ?[facility] ?[place], [place] ?[phone]   ?12/01/2021, 5:37 PM ?

## 2022-03-07 ENCOUNTER — Ambulatory Visit: Payer: BC Managed Care – PPO | Admitting: Family Medicine

## 2022-08-29 IMAGING — DX DG CHEST 2V
2 series · 2 of 2 positions shown · non-contrast
Comparison: None.

CLINICAL DATA: Abnormal EKG.

EXAM:
CHEST - 2 VIEW

[chest pa]
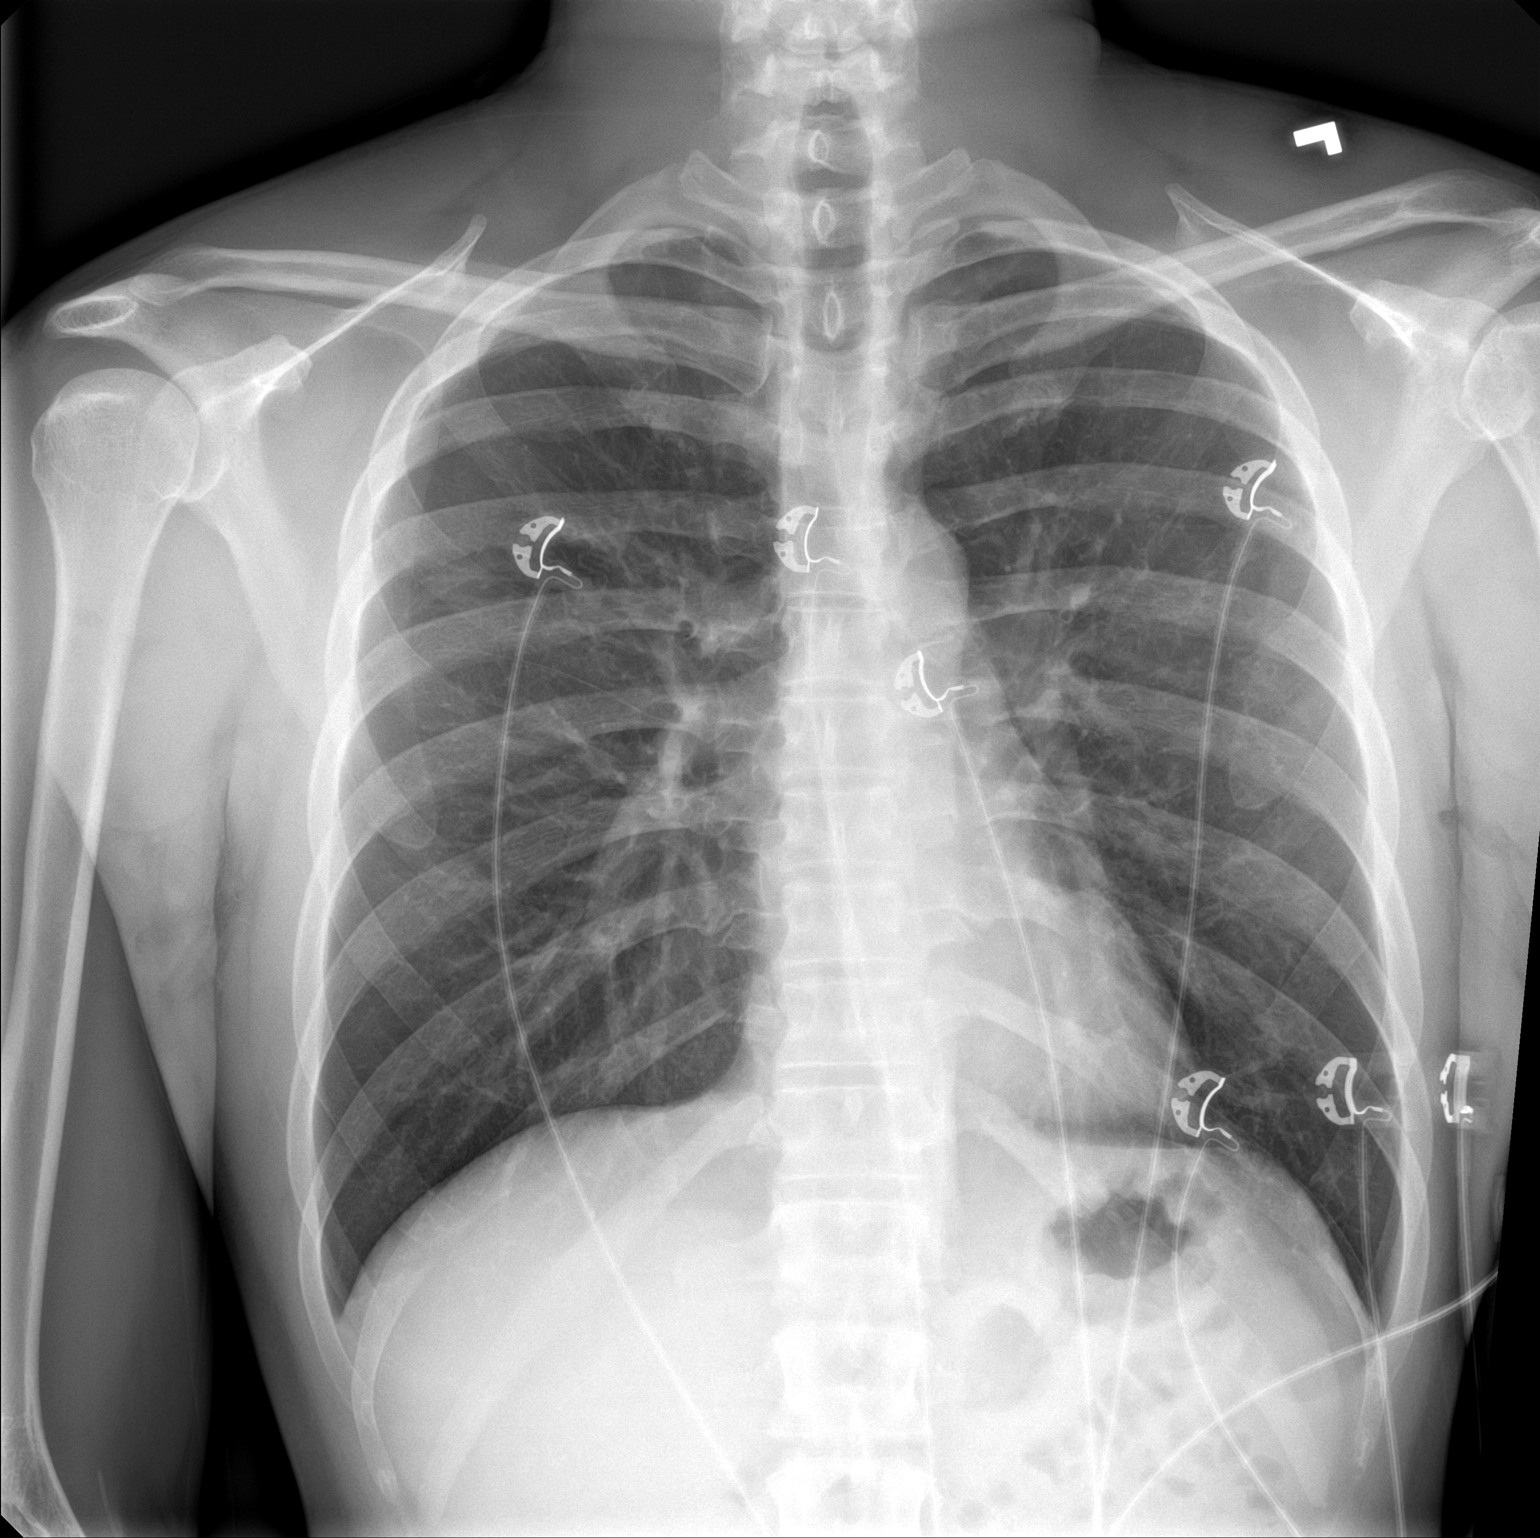

[chest lat]
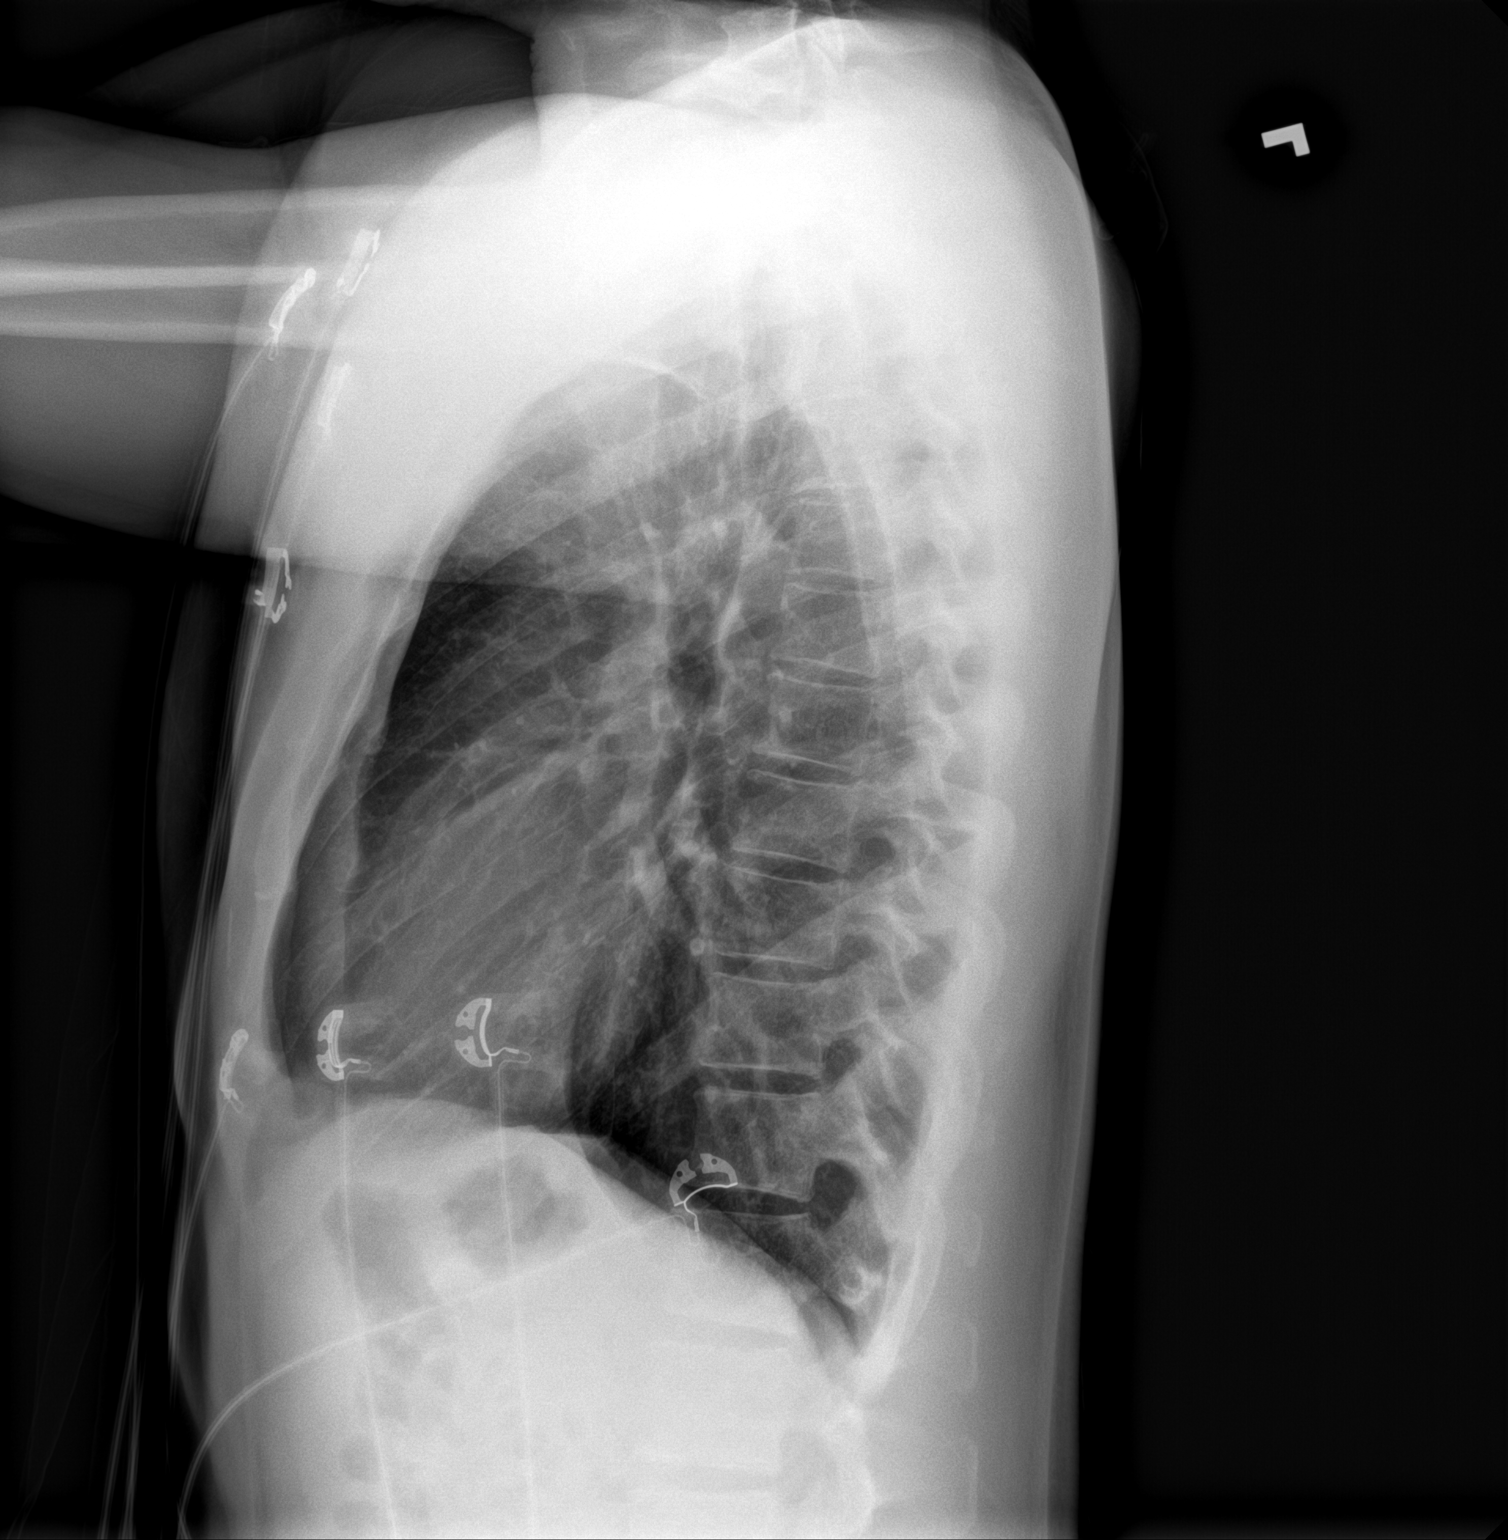

[2 of 2 positions shown; findings below may reference images not displayed]

FINDINGS: The heart size and mediastinal contours are within normal limits.
Both lungs are clear. The visualized skeletal structures are
unremarkable.
IMPRESSION: No active cardiopulmonary disease.

## 2022-10-07 ENCOUNTER — Ambulatory Visit
Admission: EM | Admit: 2022-10-07 | Discharge: 2022-10-07 | Disposition: A | Discharge status: Home / Self Care | Payer: BC Managed Care – PPO

## 2022-10-07 DIAGNOSIS — R1013 Epigastric pain: Secondary | ICD-10-CM | POA: Diagnosis not present

## 2022-10-07 DIAGNOSIS — K219 Gastro-esophageal reflux disease without esophagitis: Secondary | ICD-10-CM | POA: Diagnosis not present

## 2022-10-07 MED ORDER — OMEPRAZOLE 40 MG PO CPDR: 40.0000 mg | DELAYED_RELEASE_CAPSULE | Freq: Two times a day (BID) | ORAL | 1 refills | Status: DC

## 2022-10-07 NOTE — Discharge Instructions (Signed)
Advise to reduce caffeine intake with coffee and sodas.  Also advised to reduce fried, greasy, and spicy foods for the next several weeks.  Advised to take Prilosec 40 mg, 1 twice daily for 2 weeks and then reduce down to once daily for about 3 to 4 weeks until symptoms resolved.  Advised follow-up PCP or return to urgent care if symptoms fail to improve.

## 2022-10-07 NOTE — ED Triage Notes (Signed)
Pt presents to uc with co of mid epigastric pain. Pt reports he was diagnosed with gastritis last year and symptoms got better after being prescribed a certain medication but after 12/19 they came back after eating a dinner with friends. Pt reports he was seen virtually and was given omeprazole but it is not helping as much. Pt reports discomfort rn is 1/10. Pt reports worsens when he eats. Some mild nausea. No vomiting or loose stools

## 2022-10-07 NOTE — ED Provider Notes (Signed)
[facility]    CSN: [id number] Arrival date & time: 10/07/22  1011      History   Chief Complaint Chief Complaint  Patient presents with   Abdominal Pain    HPI Derek Knight is a 37 y.o. male.   21-year-old male presents with indigestion and reflux.  Patient indicates he has a history of having indigestion and reflux however it has been a year since he has had some difficulty with the symptoms.  Patient indicates that December 16 he ate a large meal with some hot sauce and peppers.  He relates afterwards he started having increasing heartburn in the epigastric area along with intermittent but persistent reflux.  Patient indicates that his symptoms have continued over the past couple weeks.  He indicates he had a virtual visit and was given omeprazole 40 mg daily along with Zofran for nausea.  He indicates he started this 4 days ago which has helped to improve the reflux but he continues to have intermittent dyspepsia and some mild epigastric discomfort.  Patient indicates that he has not had any nausea, vomiting, fever, chills, or diarrhea over the past several weeks.  Patient also indicates that he has not had any blood in the stools or melena.  Patient indicates he does not smoke, and he has reduced his caffeine intake over the past several days to help his symptoms improve.   Abdominal Pain   History reviewed. No pertinent past medical history.  Patient Active Problem List   Diagnosis Date Noted   Annual physical exam 04/17/2012    History reviewed. No pertinent surgical history.     Home Medications    Prior to Admission medications   Medication Sig Start Date End Date Taking? Authorizing Provider  ondansetron (ZOFRAN) 4 MG tablet Take 4 mg by mouth every 8 (eight) hours as needed. 10/03/22  Yes [provider]  Cetirizine HCl 10 MG CAPS Take 1 capsule (10 mg total) by mouth daily for 10 days. 02/12/21 02/22/21  Wieters, Hallie C, PA-C  famotidine  (PEPCID) 20 MG tablet Take 1 tablet (20 mg total) by mouth 2 (two) times daily. Patient not taking: Reported on 10/07/2022 10/15/21   Sherrill Raring, PA-C  fluticasone [facility]) 50 MCG/ACT nasal spray Place 1-2 sprays into both nostrils daily. 02/12/21   Wieters, Hallie C, PA-C  meloxicam (MOBIC) 7.5 MG tablet Take 1 tablet (7.5 mg total) by mouth daily. 01/28/20   Tasia Catchings, Amy V, PA-C  omeprazole (PRILOSEC) 40 MG capsule Take 1 capsule (40 mg total) by mouth 2 (two) times daily. For 2 weeks then reduce to once daily in morning 30 min before meal. 10/07/22   Nyoka Lint, PA-C  traZODone (DESYREL) 50 MG tablet Take 1 tablet (50 mg total) by mouth at bedtime as needed for sleep. 12/01/21   Charlott Rakes, MD    Family History Family History  Problem Relation Age of Onset   Diabetes Father     Social History Social History   Tobacco Use   Smoking status: Never   Smokeless tobacco: Never  Vaping Use   Vaping Use: Former  Substance Use Topics   Alcohol use: Yes   Drug use: Never     Allergies   Cashew nut (anacardium occidentale) skin test   Review of Systems Review of Systems  Gastrointestinal:  Positive for abdominal pain (epigastric area).     Physical Exam Triage Vital Signs ED Triage Vitals  Enc Vitals Group     BP  10/07/22 1048 (!) 134/92     Pulse Rate 10/07/22 1048 85     Resp 10/07/22 1048 19     Temp 10/07/22 1048 98.1 F (36.7 C)     Temp Source 10/07/22 1048 Oral     SpO2 10/07/22 1048 98 %     Weight --      Height --      Head Circumference --      Peak Flow --      Pain Score 10/07/22 1047 1     Pain Loc --      Pain Edu? --      Excl. in [place]? --    No data found.  Updated Vital Signs BP (!) 134/92   Pulse 85   Temp 98.1 F (36.7 C) (Oral)   Resp 19   SpO2 98%   Visual Acuity Right Eye Distance:   Left Eye Distance:   Bilateral Distance:    Right Eye Near:   Left Eye Near:    Bilateral Near:     Physical Exam Constitutional:      Appearance: He  is well-developed.  Cardiovascular:     Rate and Rhythm: Normal rate and regular rhythm.     Heart sounds: Normal heart sounds.  Pulmonary:     Effort: Pulmonary effort is normal.     Breath sounds: Normal breath sounds and air entry. No wheezing, rhonchi or rales.  Abdominal:     General: Abdomen is flat. Bowel sounds are normal.     Palpations: Abdomen is soft.     Tenderness: There is abdominal tenderness in the epigastric area. There is no guarding or rebound.    Lymphadenopathy:     Cervical: No cervical adenopathy.  Neurological:     Mental Status: He is alert.      UC Treatments / Results  Labs (all labs ordered are listed, but only abnormal results are displayed) Labs Reviewed - No data to display  EKG   Radiology No results found.  Procedures Procedures (including critical care time)  Medications Ordered in UC Medications - No data to display  Initial Impression / Assessment and Plan / UC Course  I have reviewed the triage vital signs and the nursing notes.  Pertinent labs & imaging results that were available during my care of the patient were reviewed by me and considered in my medical decision making (see chart for details).    Plan: 1.  The dyspepsia will be treated with the following: A.  Omeprazole 40 mg twice daily for 2 weeks and then reduce to once daily for 3 to 4 weeks to control symptoms. B.  Advised caffeine reduction over the next several weeks and avoid fried, greasy, spicy foods. 2.  The esophageal reflux will be treated with the following: A.  Prilosec 40 mg twice daily for 2 weeks and then reduce down to once daily for couple weeks to treat the symptoms. 3.  Patient advised follow-up PCP or return to urgent care if symptoms fail to improve. Final Clinical Impressions(s) / UC Diagnoses   Final diagnoses:  Dyspepsia  Gastroesophageal reflux disease without esophagitis     Discharge Instructions      Advise to reduce caffeine  intake with coffee and sodas.  Also advised to reduce fried, greasy, and spicy foods for the next several weeks.  Advised to take Prilosec 40 mg, 1 twice daily for 2 weeks and then reduce down to once daily for about 3 to  4 weeks until symptoms resolved.  Advised follow-up PCP or return to urgent care if symptoms fail to improve.    ED Prescriptions     Medication Sig Dispense Auth. Provider   omeprazole (PRILOSEC) 40 MG capsule Take 1 capsule (40 mg total) by mouth 2 (two) times daily. For 2 weeks then reduce to once daily in morning 30 min before meal. 60 capsule Ellsworth Lennox, PA-C      PDMP not reviewed this encounter.   Ellsworth Lennox, PA-C 10/07/22 [phone]

## 2022-10-08 DIAGNOSIS — K2900 Acute gastritis without bleeding: Secondary | ICD-10-CM | POA: Diagnosis not present

## 2022-10-08 DIAGNOSIS — K219 Gastro-esophageal reflux disease without esophagitis: Secondary | ICD-10-CM | POA: Diagnosis not present

## 2022-12-09 ENCOUNTER — Encounter: Payer: Self-pay | Admitting: Nurse Practitioner

## 2022-12-09 ENCOUNTER — Ambulatory Visit: Payer: BC Managed Care – PPO | Admitting: Nurse Practitioner

## 2022-12-09 VITALS — BP 122/86 | HR 76 | Temp 97.9°F | Ht 68.5 in | Wt 169.5 lb

## 2022-12-09 DIAGNOSIS — J310 Chronic rhinitis: Secondary | ICD-10-CM

## 2022-12-09 DIAGNOSIS — R079 Chest pain, unspecified: Secondary | ICD-10-CM

## 2022-12-09 DIAGNOSIS — Z1159 Encounter for screening for other viral diseases: Secondary | ICD-10-CM | POA: Insufficient documentation

## 2022-12-09 DIAGNOSIS — Z131 Encounter for screening for diabetes mellitus: Secondary | ICD-10-CM | POA: Insufficient documentation

## 2022-12-09 NOTE — Assessment & Plan Note (Signed)
Patient will return next week for labs, further recommendations may be made based upon the results.

## 2022-12-09 NOTE — Assessment & Plan Note (Signed)
Chronic, referral to ENT made today for further assistance with evaluation and management.

## 2022-12-09 NOTE — Assessment & Plan Note (Signed)
Sounds atypical, may be muscular skeletal in origin.  Per shared decision making we will order labs including fasting lipid panel for which she will return next week to have drawn as he is not fasting today.  If elevated may consider evaluation with cardiologist.  Otherwise continue to monitor and notify me if symptoms worsen.  Patient reports understanding.

## 2022-12-09 NOTE — Assessment & Plan Note (Signed)
Patient will return next week for labs, further recommendations may be made based upon these results.

## 2022-12-09 NOTE — Progress Notes (Signed)
New Patient Office Visit  Subjective    Patient ID: Derek Knight, male    DOB: Jan 18, 1986  Age: 37 y.o. MRN: [id number]  CC:  Chief Complaint  Patient presents with   New Patient (Initial Visit)    Sinus infection, ongoing for about two years  Self treatment with OTC   Gastroesophageal Reflux    Last year, taking omeprazole for it    HPI Derek Knight presents to establish care Has not been following with primary care routinely, has been using urgent care for medical needs.  Moved back to the [place] from [place] about 3 years ago.  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 His main concerns today include the following:  GERD: Has had intermittent reflux and heartburn symptoms since December.  Did complete treatment with omeprazole 40 mg twice a day x 2 weeks then daily for approximately 1 month.  Reports since completing that treatment symptoms have improved drastically.  Does feel some epigastric soreness intermittently.  Chronic sinus congestion: Symptom onset 2 years ago, feels chronic.  Has tried over-the-counter allergy pills as well as Flonase without improvement in his symptoms.  Also was treated with antibiotics in the past for a separate issue but did temporarily improve his symptoms.  Denies any history of broken nose, does report that he snores.  Denies use of over-the-counter Afrin.  Also reports that he has a tympanic membrane rupture to the right ear secondary to ear infection that occurred almost a year ago.  Chest pain: Nonexertional intermittent chest pain.  Reports it occurs "very rarely" last for 1 to 2 minutes and then spontaneously resolves.  Sometimes triggered by pulling heavy objects or straining but not always.  He is a non-smoker, denies family history of heart disease or stroke.  Outpatient Encounter Medications as of 12/09/2022  Medication Sig   [DISCONTINUED] omeprazole (PRILOSEC) 40 MG capsule Take 1 capsule (40 mg total) by  mouth 2 (two) times daily. For 2 weeks then reduce to once daily in morning 30 min before meal.   [DISCONTINUED] Cetirizine HCl 10 MG CAPS Take 1 capsule (10 mg total) by mouth daily for 10 days.   [DISCONTINUED] famotidine (PEPCID) 20 MG tablet Take 1 tablet (20 mg total) by mouth 2 (two) times daily. (Patient not taking: Reported on 10/07/2022)   [DISCONTINUED] fluticasone (FLONASE) 50 MCG/ACT nasal spray Place 1-2 sprays into both nostrils daily.   [DISCONTINUED] meloxicam (MOBIC) 7.5 MG tablet Take 1 tablet (7.5 mg total) by mouth daily.   [DISCONTINUED] ondansetron (ZOFRAN) 4 MG tablet Take 4 mg by mouth every 8 (eight) hours as needed.   [DISCONTINUED] traZODone (DESYREL) 50 MG tablet Take 1 tablet (50 mg total) by mouth at bedtime as needed for sleep.   No facility-administered encounter medications on file as of 12/09/2022.    Past Medical History:  Diagnosis Date   Allergy Always   Allergic to cashews and pollen   Anxiety 4 years ago   GERD (gastroesophageal reflux disease) Last January   Comes and goes diagnosis of gerd and gastritis    History reviewed. No pertinent surgical history.  Family History  Problem Relation Age of Onset   Diabetes Mother    Diabetes Father     Social History   Socioeconomic History   Marital status: Single    Spouse name: Not on file   Number of children: Not on file   Years of education: Not on file  Highest education level: Not on file  Occupational History   Not on file  Tobacco Use   Smoking status: Never   Smokeless tobacco: Never  Vaping Use   Vaping Use: Former  Substance and Sexual Activity   Alcohol use: Yes    Alcohol/week: 2.0 standard drinks of alcohol    Types: 2 Cans of beer per week    Comment: social   Drug use: Never   Sexual activity: Not Currently    Birth control/protection: Condom  Other Topics Concern   Not on file  Social History Narrative   Not on file   Social Determinants of Health   Financial  Resource Strain: Not on file  Food Insecurity: Not on file  Transportation Needs: Not on file  Physical Activity: Not on file  Stress: Not on file  Social Connections: Not on file  Intimate Partner Violence: Not on file    Review of Systems  Constitutional:  Negative for chills and fever.  HENT:  Positive for congestion. Negative for sinus pain.        (+) PND  Respiratory:  Negative for shortness of breath.   Cardiovascular:  Negative for chest pain and palpitations.  Gastrointestinal:  Positive for heartburn. Negative for abdominal pain and blood in stool.        Objective    BP 122/86   Pulse 76   Temp 97.9 F (36.6 C) (Temporal)   Ht 5' 8.5" (1.74 m)   Wt 169 lb 8 oz (76.9 kg)   SpO2 96%   BMI 25.40 kg/m   Physical Exam Vitals reviewed.  Constitutional:      Appearance: Normal appearance.  HENT:     Head: Normocephalic and atraumatic.  Cardiovascular:     Rate and Rhythm: Normal rate and regular rhythm.  Pulmonary:     Effort: Pulmonary effort is normal.     Breath sounds: Normal breath sounds.  Musculoskeletal:     Cervical back: Neck supple.  Skin:    General: Skin is warm and dry.  Neurological:     Mental Status: He is alert and oriented to person, place, and time.  Psychiatric:        Mood and Affect: Mood normal.        Behavior: Behavior normal.        Thought Content: Thought content normal.        Judgment: Judgment normal.    EKG: Normal sinus rhythm      Assessment & Plan:   Problem List Items Addressed This Visit       Respiratory   Chronic rhinitis - Primary    Chronic, referral to ENT made today for further assistance with evaluation and management.      Relevant Orders   Ambulatory referral to ENT   TSH   Hemoglobin A1c   Lipid panel   Comprehensive metabolic panel   CBC   Hepatitis C antibody     Other   Chest pain    Sounds atypical, may be muscular skeletal in origin.  Per shared decision making we will order  labs including fasting lipid panel for which she will return next week to have drawn as he is not fasting today.  If elevated may consider evaluation with cardiologist.  Otherwise continue to monitor and notify me if symptoms worsen.  Patient reports understanding.      Relevant Orders   EKG 12-Lead   TSH   Hemoglobin A1c   Lipid panel  Comprehensive metabolic panel   CBC   Hepatitis C antibody   Diabetes mellitus screening    Patient will return next week for labs, further recommendations may be made based upon these results.      Relevant Orders   TSH   Hemoglobin A1c   Lipid panel   Comprehensive metabolic panel   CBC   Hepatitis C antibody   Encounter for hepatitis C screening test for low risk patient    Patient will return next week for labs, further recommendations may be made based upon the results.      Relevant Orders   TSH   Hemoglobin A1c   Lipid panel   Comprehensive metabolic panel   CBC   Hepatitis C antibody    Return in about 1 month (around 01/09/2023) for F/U with Octa Uplinger.   Ailene Ards, NP

## 2022-12-16 ENCOUNTER — Other Ambulatory Visit: Payer: Self-pay | Admitting: Nurse Practitioner

## 2022-12-16 ENCOUNTER — Other Ambulatory Visit (INDEPENDENT_AMBULATORY_CARE_PROVIDER_SITE_OTHER): Payer: BC Managed Care – PPO

## 2022-12-16 DIAGNOSIS — R079 Chest pain, unspecified: Secondary | ICD-10-CM | POA: Diagnosis not present

## 2022-12-16 DIAGNOSIS — Z131 Encounter for screening for diabetes mellitus: Secondary | ICD-10-CM

## 2022-12-16 DIAGNOSIS — Z1159 Encounter for screening for other viral diseases: Secondary | ICD-10-CM | POA: Diagnosis not present

## 2022-12-16 DIAGNOSIS — J310 Chronic rhinitis: Secondary | ICD-10-CM | POA: Diagnosis not present

## 2022-12-16 LAB — CBC
HCT: 46.2 % (ref 39.0–52.0)
Hemoglobin: 15.8 g/dL (ref 13.0–17.0)
MCHC: 34.1 g/dL (ref 30.0–36.0)
MCV: 90.2 fl (ref 78.0–100.0)
Platelets: 286 10*3/uL (ref 150.0–400.0)
RBC: 5.12 Mil/uL (ref 4.22–5.81)
RDW: 12.8 % (ref 11.5–15.5)
WBC: 5.6 10*3/uL (ref 4.0–10.5)

## 2022-12-16 LAB — COMPREHENSIVE METABOLIC PANEL
ALT: 30 U/L (ref 0–53)
AST: 25 U/L (ref 0–37)
Albumin: 4.3 g/dL (ref 3.5–5.2)
Alkaline Phosphatase: 62 U/L (ref 39–117)
BUN: 8 mg/dL (ref 6–23)
CO2: 29 mEq/L (ref 19–32)
Calcium: 9.4 mg/dL (ref 8.4–10.5)
Chloride: 102 mEq/L (ref 96–112)
Creatinine, Ser: 1.05 mg/dL (ref 0.40–1.50)
GFR: 91.46 mL/min (ref >60.00)
Glucose, Bld: 86 mg/dL (ref 70–99)
Potassium: 3.9 mEq/L (ref 3.5–5.1)
Sodium: 138 mEq/L (ref 135–145)
Total Bilirubin: 0.6 mg/dL (ref 0.2–1.2)
Total Protein: 7.7 g/dL (ref 6.0–8.3)

## 2022-12-16 LAB — LIPID PANEL
Cholesterol: 217 mg/dL — ABNORMAL HIGH (ref 0–200)
HDL: 62 mg/dL (ref >39.00)
LDL Cholesterol: 145 mg/dL — ABNORMAL HIGH (ref 0–99)
NonHDL: 155.45
Total CHOL/HDL Ratio: 4
Triglycerides: 52 mg/dL (ref 0.0–149.0)
VLDL: 10.4 mg/dL (ref 0.0–40.0)

## 2022-12-16 LAB — HEMOGLOBIN A1C: Hgb A1c MFr Bld: 5.3 % (ref 4.6–6.5)

## 2022-12-16 LAB — TSH: TSH: 1.36 u[IU]/mL (ref 0.35–5.50)

## 2022-12-19 LAB — HEPATITIS C ANTIBODY: Hepatitis C Ab: NONREACTIVE

## 2023-01-13 ENCOUNTER — Encounter: Payer: Self-pay | Admitting: Nurse Practitioner

## 2023-01-13 ENCOUNTER — Ambulatory Visit: Payer: BC Managed Care – PPO | Admitting: Nurse Practitioner

## 2023-01-13 VITALS — BP 108/84 | HR 76 | Temp 97.9°F | Ht 68.5 in | Wt 172.5 lb

## 2023-01-13 DIAGNOSIS — E785 Hyperlipidemia, unspecified: Secondary | ICD-10-CM | POA: Diagnosis not present

## 2023-01-13 DIAGNOSIS — R079 Chest pain, unspecified: Secondary | ICD-10-CM | POA: Diagnosis not present

## 2023-01-13 DIAGNOSIS — Z23 Encounter for immunization: Secondary | ICD-10-CM

## 2023-01-13 DIAGNOSIS — Z0001 Encounter for general adult medical examination with abnormal findings: Secondary | ICD-10-CM | POA: Diagnosis not present

## 2023-01-13 DIAGNOSIS — H6122 Impacted cerumen, left ear: Secondary | ICD-10-CM

## 2023-01-13 DIAGNOSIS — J310 Chronic rhinitis: Secondary | ICD-10-CM | POA: Diagnosis not present

## 2023-01-13 DIAGNOSIS — H6120 Impacted cerumen, unspecified ear: Secondary | ICD-10-CM | POA: Insufficient documentation

## 2023-01-13 DIAGNOSIS — H7291 Unspecified perforation of tympanic membrane, right ear: Secondary | ICD-10-CM

## 2023-01-13 MED ORDER — LEVOCETIRIZINE DIHYDROCHLORIDE 5 MG PO TABS: 5.0000 mg | ORAL_TABLET | Freq: Every evening | ORAL | 1 refills | Status: AC

## 2023-01-13 NOTE — Assessment & Plan Note (Signed)
Patient provided with phone number to ENT's office.  Will also trial Xyzal 5 mg by mouth daily.

## 2023-01-13 NOTE — Addendum Note (Signed)
Addended by: Cathleen Fears, Eulice Rutledge P on: 01/13/2023 10:25 AM   Modules accepted: Orders

## 2023-01-13 NOTE — Progress Notes (Signed)
PRE-PROCEDURE EXAM: Left TM cannot be visualized due to total occlusion/impaction of the ear canal.  PROCEDURE INDICATION: remove wax to visualize ear drum & relieve discomfort  CONSENT:  Verbal     PROCEDURE NOTE:     Left EAR:  I used warm water irrigation under direct visualization with the otoscope to free the wax bolus from the ear canal.    POST- PROCEDURE EXAM: Left TMs successfully visualized and found to have no erythema     The patient tolerated the procedure well.   

## 2023-01-13 NOTE — Patient Instructions (Signed)
ENT - [phone]

## 2023-01-13 NOTE — Assessment & Plan Note (Signed)
L TM visualized after lavage, patient tolerated procedure well. TM wnl.

## 2023-01-13 NOTE — Assessment & Plan Note (Signed)
Tdap to be administered today, VIS provided.  Patient encouraged to follow heart healthy diet, exercise 150 minutes/week, we also discussed safety recommendations.

## 2023-01-13 NOTE — Assessment & Plan Note (Signed)
Difficult to determine if TM is intact or ruptured on exam today. Encouraged patient to have ENT evaluate this as well.

## 2023-01-13 NOTE — Assessment & Plan Note (Signed)
For now patient encouraged to focus on lifestyle modification aimed at reducing intake of fast foods and focus on lean proteins, fruits, and veggies.  Patient reports understanding.  Will defer decision to start cholesterol-lowering medication to cardiology once they evaluate him for his chest pain.

## 2023-01-13 NOTE — Progress Notes (Signed)
Established Patient Office Visit  Subjective   Patient ID: Derek Knight, male    DOB: 08-10-1986  Age: 37 y.o. MRN: [id number]  Chief Complaint  Patient presents with   Medical Management of Chronic Issues    1 month follow up, no new issue     Patient arrives for follow-up to discuss previous labs as well as for annual physical exam.  Health maintenance: Due for tetanus shot, hep C screening 12/16/2022.  Chronic rhinitis: Continues to have chronic rhinitis, has tried and failed over-the-counter antihistamines and Flonase nasal spray.  Was referred to ENT has not yet heard from their office to schedule an appointment.  Question that he may have chronic sinusitis as he reported taking antibiotic for a separate issue which resulted in improvement of his symptoms as well.  Chest pain: Has had 1 additional episode of nonexertional chest pain.  Resolved spontaneously.  Scheduled to see cardiology next month for evaluation.  Hyperlipidemia: Noted on previous labs, fasting LDL 145 and total cholesterol 217.  Not currently on cholesterol-lowering medication.    Review of Systems  Constitutional:  Negative for fever, malaise/fatigue and weight loss.  HENT:         (+) PND  Respiratory:  Positive for cough. Negative for shortness of breath and wheezing.   Cardiovascular:  Positive for chest pain. Negative for palpitations.  Gastrointestinal:  Negative for abdominal pain, blood in stool, constipation, diarrhea, nausea and vomiting.  Neurological:  Negative for dizziness, seizures, loss of consciousness and headaches.  Psychiatric/Behavioral:  Negative for depression and suicidal ideas. The patient is not nervous/anxious.       Objective:     BP 108/84   Pulse 76   Temp 97.9 F (36.6 C) (Temporal)   Ht 5' 8.5" (1.74 m)   Wt 172 lb 8 oz (78.2 kg)   SpO2 97%   BMI 25.85 kg/m       01/13/2023    9:26 AM 12/09/2022   10:21 AM 12/01/2021    2:53 PM  PHQ9 SCORE ONLY  PHQ-9 Total  Score 0 1 4      Physical Exam Vitals reviewed.  Constitutional:      General: He is not in acute distress.    Appearance: Normal appearance. He is not ill-appearing.  HENT:     Head: Normocephalic and atraumatic.     Right Ear: Ear canal and external ear normal.     Left Ear: Tympanic membrane, ear canal and external ear normal. There is impacted cerumen.     Ears:     Comments: Right tympanic membrane slightly hard to visualize, does appear that it may still be ruptured. Eyes:     General: No scleral icterus.    Extraocular Movements: Extraocular movements intact.     Conjunctiva/sclera: Conjunctivae normal.     Pupils: Pupils are equal, round, and reactive to light.  Neck:     Vascular: No carotid bruit.  Cardiovascular:     Rate and Rhythm: Normal rate and regular rhythm.     Pulses: Normal pulses.     Heart sounds: Normal heart sounds.  Pulmonary:     Effort: Pulmonary effort is normal.     Breath sounds: Normal breath sounds.  Abdominal:     General: Bowel sounds are normal. There is no distension.     Palpations: There is no mass.     Tenderness: There is no abdominal tenderness.     Hernia: No hernia is present.  Musculoskeletal:        General: No swelling or tenderness.     Cervical back: Normal range of motion and neck supple. No rigidity.  Lymphadenopathy:     Cervical: No cervical adenopathy.  Skin:    General: Skin is warm and dry.  Neurological:     General: No focal deficit present.     Mental Status: He is alert and oriented to person, place, and time.     Cranial Nerves: No cranial nerve deficit.     Sensory: No sensory deficit.     Motor: No weakness.     Gait: Gait normal.  Psychiatric:        Mood and Affect: Mood normal.        Behavior: Behavior normal.        Judgment: Judgment normal.      No results found for any visits on 01/13/23.    The ASCVD Risk score (Arnett DK, et al., 2019) failed to calculate for the following reasons:    The 2019 ASCVD risk score is only valid for ages 55 to 40    Assessment & Plan:   Problem List Items Addressed This Visit       Respiratory   Chronic rhinitis    Patient provided with phone number to ENT's office.  Will also trial Xyzal 5 mg by mouth daily.      Relevant Medications   levocetirizine (XYZAL) 5 MG tablet     Nervous and Auditory   Cerumen impaction    L TM visualized after lavage, patient tolerated procedure well. TM wnl.      Perforation of right tympanic membrane    Difficult to determine if TM is intact or ruptured on exam today. Encouraged patient to have ENT evaluate this as well.         Other   Chest pain    Intermittent, continues to be atypical.  Due to patient's hyperlipidemia he was referred to cardiology for evaluation.  Has an appointment scheduled next month, encouraged to follow-up as scheduled.      Encounter for general adult medical examination with abnormal findings - Primary    Tdap to be administered today, VIS provided.  Patient encouraged to follow heart healthy diet, exercise 150 minutes/week, we also discussed safety recommendations.      HLD (hyperlipidemia)    For now patient encouraged to focus on lifestyle modification aimed at reducing intake of fast foods and focus on lean proteins, fruits, and veggies.  Patient reports understanding.  Will defer decision to start cholesterol-lowering medication to cardiology once they evaluate him for his chest pain.       Return in about 1 year (around 01/13/2024) for CPE with Fidencio Duddy.    Elenore Paddy, NP

## 2023-01-13 NOTE — Assessment & Plan Note (Signed)
Intermittent, continues to be atypical.  Due to patient's hyperlipidemia he was referred to cardiology for evaluation.  Has an appointment scheduled next month, encouraged to follow-up as scheduled.

## 2023-02-03 ENCOUNTER — Ambulatory Visit: Payer: BC Managed Care – PPO | Admitting: Cardiovascular Disease

## 2023-03-10 ENCOUNTER — Ambulatory Visit: Payer: BC Managed Care – PPO | Admitting: Internal Medicine

## 2024-01-26 ENCOUNTER — Encounter: Payer: BC Managed Care – PPO | Admitting: Nurse Practitioner

## 2024-04-19 ENCOUNTER — Encounter: Admitting: Nurse Practitioner
